# Patient Record
Sex: Female | Born: 2000 | Race: White | Hispanic: Yes | Marital: Single
Health system: Southern US, Community
[De-identification: ages and names within clinical notes are randomized; demographics above are authoritative.]

## PROBLEM LIST (undated history)

## (undated) DIAGNOSIS — Z9109 Other allergy status, other than to drugs and biological substances: Secondary | ICD-10-CM

## (undated) HISTORY — PX: SEPTOPLASTY: SUR1290

---
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
## 2021-07-23 ENCOUNTER — Other Ambulatory Visit (HOSPITAL_COMMUNITY): Payer: Self-pay

## 2021-07-23 MED ORDER — AMPHETAMINE SULFATE 10 MG PO TABS
10.0000 mg | ORAL_TABLET | Freq: Two times a day (BID) | ORAL | 0 refills | Status: AC
  Filled 2021-07-23 (×2): qty 60, 30d supply, fill #0

## 2021-07-24 ENCOUNTER — Other Ambulatory Visit (HOSPITAL_COMMUNITY): Payer: Self-pay

## 2021-07-25 ENCOUNTER — Other Ambulatory Visit (HOSPITAL_COMMUNITY): Payer: Self-pay

## 2021-07-25 MED ORDER — AMPHETAMINE SULFATE 10 MG PO TABS
ORAL_TABLET | ORAL | 0 refills | Status: AC
  Filled 2021-07-25 (×3): qty 60, 30d supply, fill #0

## 2022-08-29 ENCOUNTER — Other Ambulatory Visit (HOSPITAL_COMMUNITY): Payer: Self-pay

## 2022-08-29 MED ORDER — FLUTICASONE PROPIONATE 50 MCG/ACT NA SUSP
2.0000 | Freq: Every day | NASAL | 3 refills | Status: AC
  Filled 2022-08-29: qty 48, 90d supply, fill #0

## 2022-08-29 MED ORDER — LORATADINE 10 MG PO TABS
10.0000 mg | ORAL_TABLET | Freq: Every day | ORAL | 3 refills | Status: AC
  Filled 2022-08-29: qty 90, 90d supply, fill #0

## 2022-08-29 MED ORDER — NAPROXEN 375 MG PO TABS
375.0000 mg | ORAL_TABLET | Freq: Three times a day (TID) | ORAL | 4 refills | Status: AC | PRN
  Filled 2022-08-29: qty 90, 30d supply, fill #0

## 2022-09-01 ENCOUNTER — Other Ambulatory Visit (HOSPITAL_COMMUNITY): Payer: Self-pay

## 2022-09-06 ENCOUNTER — Other Ambulatory Visit (HOSPITAL_COMMUNITY): Payer: Self-pay

## 2022-09-09 ENCOUNTER — Other Ambulatory Visit (HOSPITAL_COMMUNITY): Payer: Self-pay

## 2023-11-15 ENCOUNTER — Encounter (HOSPITAL_BASED_OUTPATIENT_CLINIC_OR_DEPARTMENT_OTHER): Payer: Self-pay | Admitting: Emergency Medicine

## 2023-11-15 ENCOUNTER — Emergency Department (HOSPITAL_BASED_OUTPATIENT_CLINIC_OR_DEPARTMENT_OTHER)
Admission: EM | Admit: 2023-11-15 | Discharge: 2023-11-15 | Disposition: A | Discharge status: Home / Self Care | Attending: Emergency Medicine | Admitting: Emergency Medicine

## 2023-11-15 ENCOUNTER — Other Ambulatory Visit: Payer: Self-pay

## 2023-11-15 DIAGNOSIS — R21 Rash and other nonspecific skin eruption: Secondary | ICD-10-CM | POA: Diagnosis present

## 2023-11-15 MED ORDER — FAMOTIDINE 20 MG PO TABS
20.0000 mg | ORAL_TABLET | Freq: Once | ORAL | Status: AC
  Administered 2023-11-15: 20 mg via ORAL
  Filled 2023-11-15: qty 1

## 2023-11-15 MED ORDER — DEXAMETHASONE SOD PHOSPHATE PF 10 MG/ML IJ SOLN
8.0000 mg | Freq: Once | INTRAMUSCULAR | Status: AC
  Administered 2023-11-15: 8 mg via INTRAMUSCULAR

## 2023-11-15 NOTE — ED Triage Notes (Signed)
 Pt c/o itchy rash to face that has spread from LT to RT side today; had Benadryl at 2030

## 2023-11-15 NOTE — ED Notes (Signed)
 D/c paperwork reviewed with pt, including follow up care.  All questions and/or concerns addressed at time of d/c.  No further needs expressed. . Pt verbalized understanding, Ambulatory without assistance to ED exit, NAD.

## 2023-11-15 NOTE — Discharge Instructions (Signed)
 Your exam is reassuring today.  It appears you may have a mild allergic reaction we have given you famotidine and a steroid shot today.  This should help with any swelling or allergic reaction.  It is recommended to follow-up with a primary care this week for further evaluation.  If you have any other concerning symptoms including shortness of breath or chest pain please return to ED for further evaluation.

## 2023-11-15 NOTE — ED Provider Notes (Signed)
 [place] [facility] Provider Note   CSN: [id number] Arrival date & time: 11/15/23  2112     Patient presents with: Rash   Morgan Harris is a 23 y.o. female.  23 year old female presents to ED with complaints of full body rash.  Patient advises over the last 3 weeks patient has noted very small bumps on her body that appear randomly and are itchy.  Patient reports she will scratch them and the area will turn red.  Shortly after this incident the erythema will decrease and local no longer itch.  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  Patient reports she has eaten eggs and milks routinely since without difficulty.  Patient denies any change to laundry detergent, lotion, or diet.  Patient advises yesterday she went to go get a facial and today her face has been erythematous and swollen.  She took 2 doses of Benadryl today with some improvement but is concerned she is having allergic reaction and wanted to be assessed.  Patient denies any past medical history denies any daily medications.     Prior to Admission medications   Medication Sig Start Date End Date Taking? Authorizing Provider  albuterol  (PROVENTIL  HFA;VENTOLIN  HFA) 108 (90 BASE) MCG/ACT inhaler Inhale 2 puffs into the lungs every 4 (four) hours as needed for wheezing or shortness of breath (coughing). 09/13/14   Remonia Alm PARAS, MD  Amphetamine  Sulfate 10 MG TABS Take 1 tablet (10 mg) by mouth 2 (two) times daily. 07/23/21     Amphetamine  Sulfate 10 MG TABS Take 1 tablet by mouth 2 times a day 07/25/21     fluticasone  (FLONASE ) 50 MCG/ACT nasal spray Place 2 sprays into both nostrils daily. 08/29/22     loratadine  (CLARITIN ) 10 MG tablet Take 1 tablet (10 mg total) by mouth daily for allergic rhinitis 08/29/22     naproxen  (NAPROSYN ) 375 MG tablet Take 1 tablet (375 mg total) by mouth 3  (three) times daily as needed for pain 08/29/22     Spacer/Aero-Holding Chambers DEVI Use as directed 09/13/14   Remonia Alm PARAS, MD    Allergies: Cetirizine    Review of Systems  Skin:  Positive for rash.  All other systems reviewed and are negative.   Updated Vital Signs BP (!) 129/96 (BP Location: Right Arm)   Pulse 98   Temp 98.7 F (37.1 C) (Oral)   Resp 20   Ht 5' 2 (1.575 m)   Wt 68 kg   SpO2 98%   BMI 27.44 kg/m   Physical Exam Vitals and nursing note reviewed.  Constitutional:      Appearance: Normal appearance.  HENT:     Head: Normocephalic and atraumatic.     Right Ear: Tympanic membrane, ear canal and external ear normal.     Left Ear: Tympanic membrane, ear canal and external ear normal.     Nose: Nose normal.     Mouth/Throat:     Pharynx: Oropharynx is clear.  Eyes:     Extraocular Movements: Extraocular movements intact.     Conjunctiva/sclera: Conjunctivae normal.     Pupils: Pupils are equal, round, and reactive to light.  Cardiovascular:     Rate and Rhythm: Normal rate.  Pulmonary:     Effort: Pulmonary effort is normal. No respiratory distress.     Breath sounds: No wheezing.  Abdominal:  General: Abdomen is flat.  Musculoskeletal:        General: Normal range of motion.     Cervical back: Normal range of motion.     Right lower leg: No edema.     Left lower leg: No edema.  Skin:    General: Skin is warm.     Capillary Refill: Capillary refill takes less than 2 seconds.     Findings: Rash present.  Neurological:     General: No focal deficit present.     Mental Status: She is alert.  Psychiatric:        Mood and Affect: Mood normal.        Behavior: Behavior normal.     (all labs ordered are listed, but only abnormal results are displayed) Labs Reviewed - No data to display  EKG: None  Radiology: No results found.   Procedures   Medications Ordered in the ED  famotidine (PEPCID) tablet 20 mg (20 mg Oral Given 11/15/23  2212)  dexamethasone (DECADRON) injection 8 mg (8 mg Intramuscular Given 11/15/23 2213)    23 y.o. female presents to the ED with complaints of rash, this involves an extensive number of treatment options, and is a complaint that carries with it a high risk of complications and morbidity.  The differential diagnosis includes anaphylaxis, contact dermatitis, SJS, (Ddx)  On arrival pt is nontoxic, vitals unremarkable. Exam significant for mild swelling throughout the face with some erythema.  I ordered medication Decadron and famotidine for allergic reaction  ED Course:   23 year old female presents ED with complaints of full body rash.  On exam patient is sitting comfortably in ED bed in no acute distress nontoxic-appearing.  Patient reports the full-body rash has been going on for roughly 3 weeks and reports that she will have a small bump that is itchy and she will scratch it causing it to be erythematous.  It well resolve on its own.  Patient is more concerned about the facial swelling and rash that appeared this morning.  Patient did have a a facial yesterday and woke up today with erythema throughout the face.  2 doses of Benadryl improved symptoms.  There is mild swelling and erythema throughout the face.  Ear canals and TMs are unremarkable.  Uvula is midline and there is no swelling throughout the oropharynx.  Patient is handling secretions well and airway patent.  Patient was given famotidine and Decadron in ED.  Patient was advised to follow-up with primary care this week and she reports she has already planned to follow-up with them tomorrow.  Patient was given strict return precautions and agreed with treatment plan.   Portions of this note were generated with Scientist, clinical (histocompatibility and immunogenetics). Dictation errors may occur despite best attempts at proofreading.   Final diagnoses:  Rash    ED Discharge Orders     None          [name] 11/15/23 2311    Cottie [name], MD 11/16/23 [phone]

## 2023-12-07 ENCOUNTER — Other Ambulatory Visit (HOSPITAL_COMMUNITY): Payer: Self-pay
